# Patient Record
Sex: Male | Born: 1985 | Race: Black or African American | Hispanic: No | Marital: Single | Smoking: Current every day smoker
Health system: Southern US, Community
[De-identification: ages and names within clinical notes are randomized; demographics above are authoritative.]

## PROBLEM LIST (undated history)

## (undated) DIAGNOSIS — I10 Essential (primary) hypertension: Secondary | ICD-10-CM

---

## 2007-10-21 ENCOUNTER — Emergency Department: Payer: Self-pay | Admitting: Emergency Medicine

## 2007-10-22 ENCOUNTER — Emergency Department: Payer: Self-pay | Admitting: Emergency Medicine

## 2007-11-02 ENCOUNTER — Ambulatory Visit: Payer: Self-pay | Admitting: Orthopedic Surgery

## 2007-11-05 ENCOUNTER — Ambulatory Visit: Payer: Self-pay | Admitting: Orthopedic Surgery

## 2007-11-15 ENCOUNTER — Emergency Department: Payer: Self-pay | Admitting: Internal Medicine

## 2007-11-17 ENCOUNTER — Emergency Department: Payer: Self-pay | Admitting: Internal Medicine

## 2012-06-03 ENCOUNTER — Emergency Department: Payer: Self-pay | Admitting: *Deleted

## 2012-11-28 ENCOUNTER — Emergency Department: Payer: Self-pay | Admitting: Emergency Medicine

## 2012-11-28 LAB — CBC
HCT: 44 % (ref 40.0–52.0)
MCHC: 34.6 g/dL (ref 32.0–36.0)
Platelet: 204 10*3/uL (ref 150–440)
WBC: 6.1 10*3/uL (ref 3.8–10.6)

## 2012-11-28 LAB — URINALYSIS, COMPLETE
Bilirubin,UR: NEGATIVE
Ph: 6 (ref 4.5–8.0)
Protein: NEGATIVE
RBC,UR: 3 /HPF (ref 0–5)
WBC UR: <1 /HPF (ref 0–5)

## 2012-11-28 LAB — DRUG SCREEN, URINE
Cocaine Metabolite,Ur ~~LOC~~: NEGATIVE (ref ?–300)
MDMA (Ecstasy)Ur Screen: NEGATIVE (ref ?–500)
Methadone, Ur Screen: NEGATIVE (ref ?–300)
Opiate, Ur Screen: NEGATIVE (ref ?–300)
Phencyclidine (PCP) Ur S: NEGATIVE (ref ?–25)
Tricyclic, Ur Screen: NEGATIVE (ref ?–1000)

## 2012-11-28 LAB — COMPREHENSIVE METABOLIC PANEL
Albumin: 4.3 g/dL (ref 3.4–5.0)
Alkaline Phosphatase: 96 U/L (ref 50–136)
Anion Gap: 8 (ref 7–16)
BUN: 15 mg/dL (ref 7–18)
Bilirubin,Total: 0.2 mg/dL (ref 0.2–1.0)
Calcium, Total: 9.5 mg/dL (ref 8.5–10.1)
Chloride: 109 mmol/L — ABNORMAL HIGH (ref 98–107)
Co2: 26 mmol/L (ref 21–32)
Creatinine: 1.08 mg/dL (ref 0.60–1.30)
EGFR (African American): >60
EGFR (Non-African Amer.): >60
Glucose: 97 mg/dL (ref 65–99)
Osmolality: 286 (ref 275–301)
SGOT(AST): 40 U/L — ABNORMAL HIGH (ref 15–37)
SGPT (ALT): 46 U/L (ref 12–78)
Total Protein: 7.9 g/dL (ref 6.4–8.2)

## 2013-01-11 ENCOUNTER — Emergency Department: Payer: Self-pay | Admitting: Emergency Medicine

## 2014-02-04 ENCOUNTER — Emergency Department: Payer: Self-pay | Admitting: Emergency Medicine

## 2014-03-01 ENCOUNTER — Emergency Department: Payer: Self-pay | Admitting: Emergency Medicine

## 2014-08-18 IMAGING — CR RIGHT FOOT COMPLETE - 3+ VIEW
1 series · 3 of 3 positions shown · non-contrast
Comparison: None.

CLINICAL DATA: Pain post fall

EXAM:
RIGHT FOOT COMPLETE - 3+ VIEW

[Series 1: x foot lat right · 0.14mm/px · 3 of 3 slices shown]
[im 1/3]
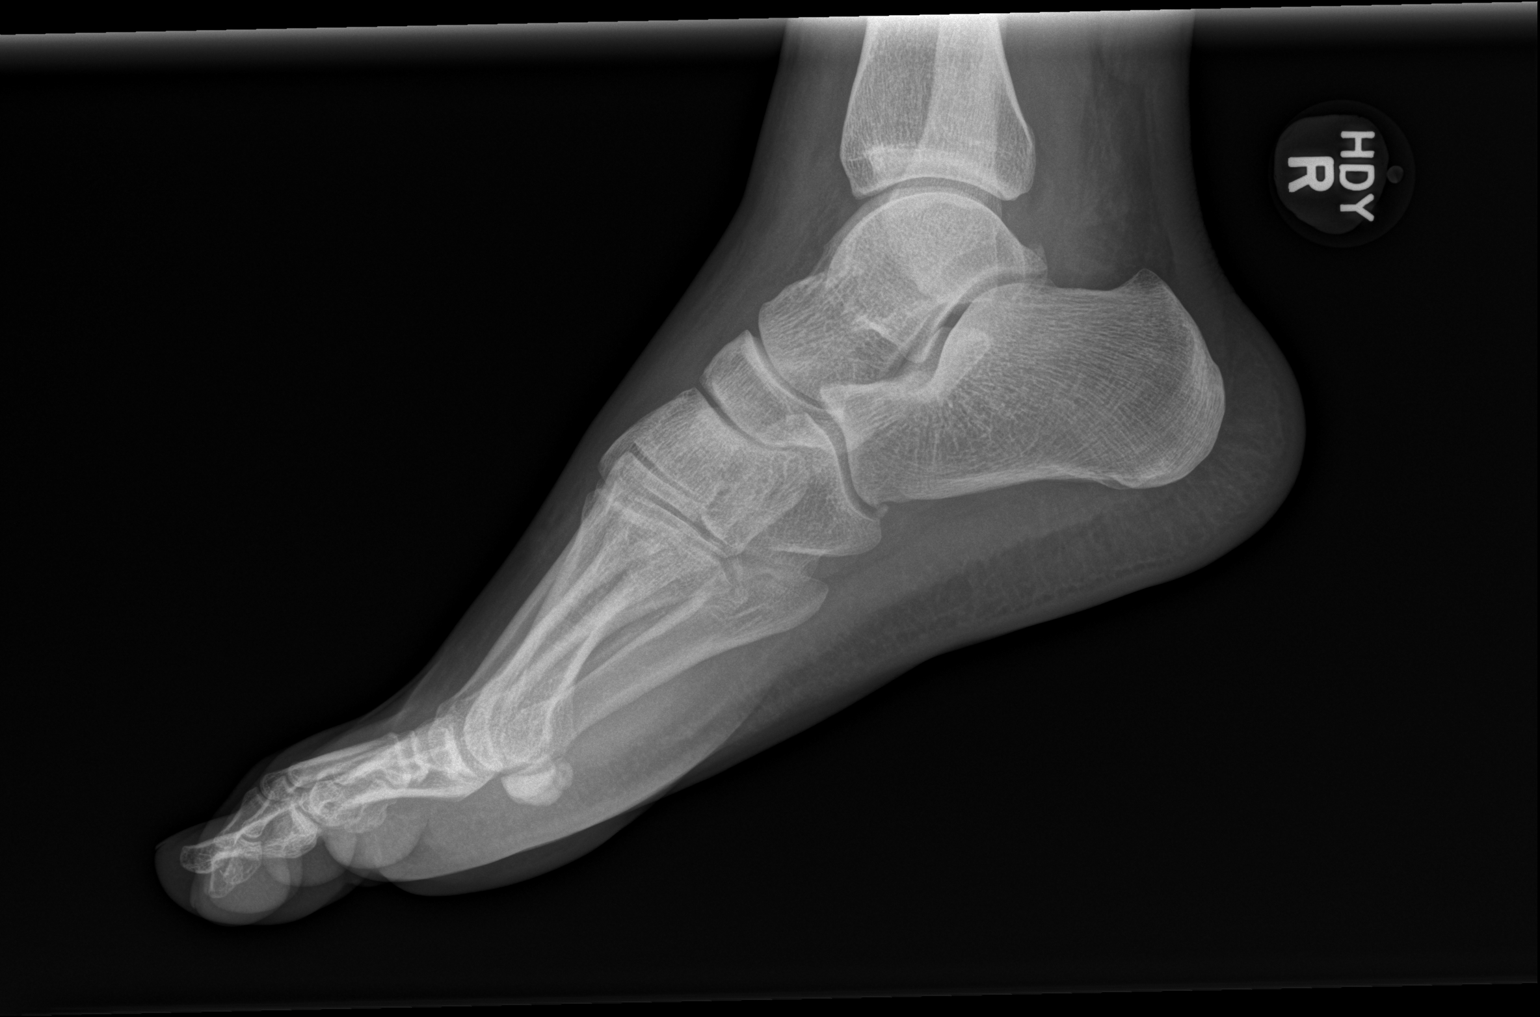
[im 2/3]
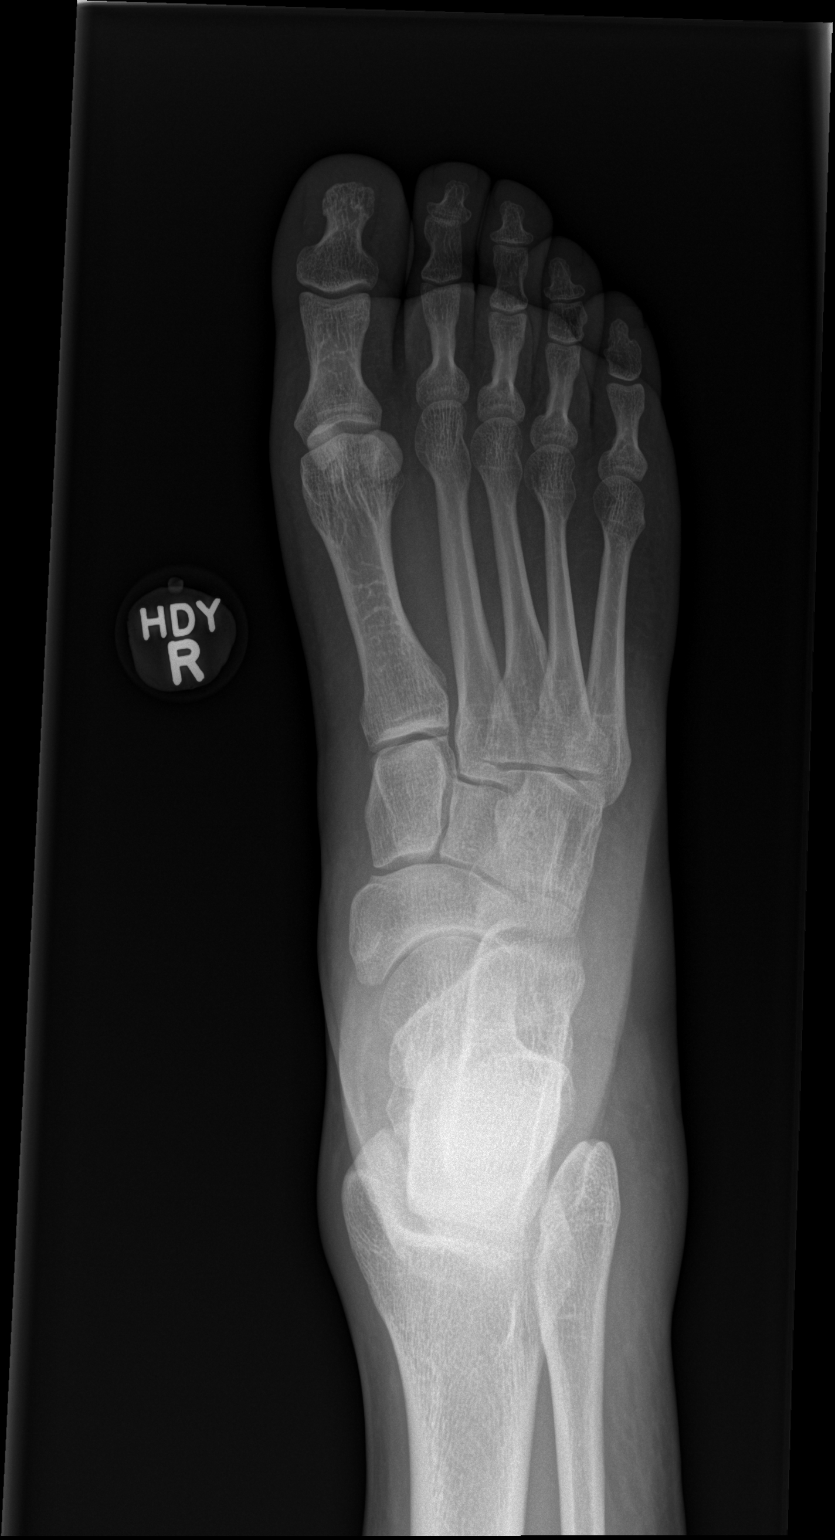
[im 3/3]
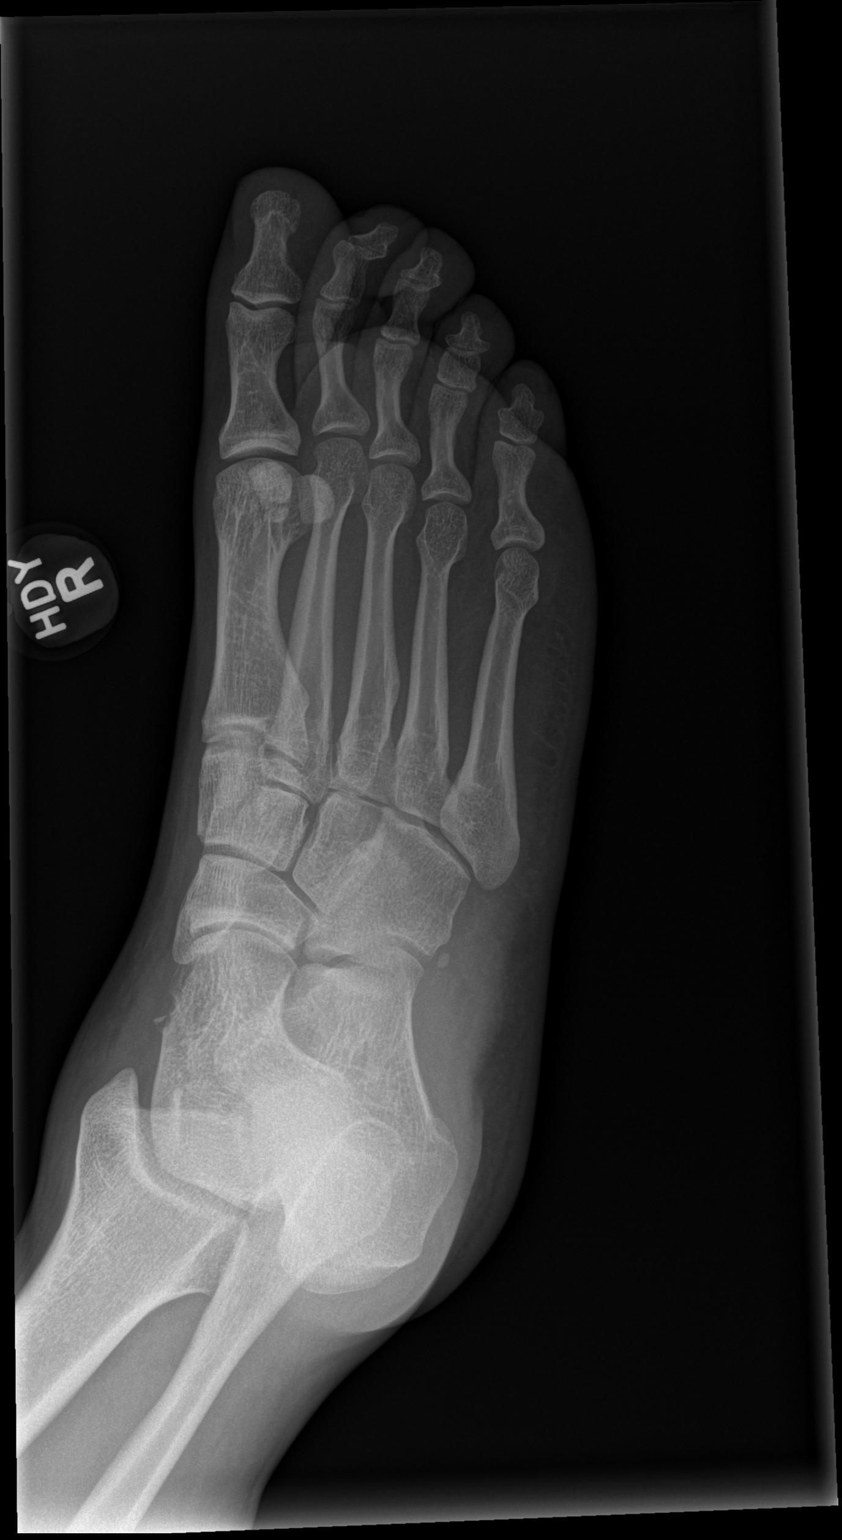

[3 of 3 positions shown; findings below may reference images not displayed]

FINDINGS: Three views of the right foot submitted. No displaced fracture or
subluxation. Tiny bony fragment and cortical irregularity medial
aspect of the talus suspicious for avulsion fracture. Clinical
correlation is necessary.
IMPRESSION: No displaced fracture or subluxation. Tiny bony fragment and
cortical irregularity medial aspect of the talus suspicious for
avulsion fracture. Clinical correlation is necessary.

## 2015-03-20 NOTE — Consult Note (Signed)
Chief Complaint:   Chief Complaint Pt is a 29 yo AAM IVC by his sister.   Presenting Symptoms:   Presenting Symptoms Mood Disturbance  Suicidal Ideation  Substance Use   History of Present Illness:   History of Present Illness Pt was brought by his sister as he posted on the face book that he has SI while being intoxicated and was playing russion roulette with his friends. He has extensive legal history and is currently on probation. He stated that he used Alcohol and only drank couple of glasses on the new year and was eating weed laced brownies with his friends. His BAL - 9 and UDS ids positive for MJ . However, he stated that he is very careful as he gets drug tested often and his probation officer  tested him on Dec 31 and will be tested again on Jan 22. He will use green tea to flush himself off. He stated that he has fair relationship with his sister as she wants him to get help for his drug use. He stated that he was charged with B&E, assault on male and drug possesion in the past but now he is tryng to get help and goes to church every weekend.  He broke with his girlfriend 3 week ago and has no close relationship. He feels depressed and stated that he does not have feel suicidal, homicidal at this time. He denied perceptual disturbances.   Precipitating Event & Recent Stressors:   Precipitating Event & Recent Stressors Legal Problems  Employment Problems  Relationship Problems  Losses  Death of mother.    Precipitating Event & Recent Stressors Relationship issues with girl friend   Target Symptoms:   Depressive Interest Loss  Anhedonia  Depressed Mood    Behavior Impulsivity    Capacity Recognizes the presence of illlness  Understands consequences of treatment refusal  Understands risks, benefits, alternatives  Communicates clear choices  Can care for self independently   Past Psychiatric Treatment: First Treatment: Was in treatment since he was a teenager. He was DX with  Bipolar. No meds.   Previous Hospitalizations: Multiple ER visits. Last seen by Psychiatrist in prison in Oct but no meds prescribed.   History of Suicide Attempts: Denied.   Current Outpatient Treatment: Declined.   Substance Abuse Treatment History: no history.   Current Psychotropic Medications: None.   The patient reports a history of non-compliance with prescribed medication regimen..  Substance Abuse- Alcohol: The patient drinks alcohol socially, not more than 1-2 drinks per week, and denies any binge drinking..  Substance Abuse- Cannabis: The patient currently uses cannabis on a regular basis.Marland Kitchen  PAST MEDICAL & SURGICAL HX:  Significant Events:   bipolar:   Social History: Lives with sister.  Legal History: Extensive legal history. Currently on probation. Just released 2 weeks ago.  Mental Status Exam:   Mental Status Exam Moderately built male who appeared his stated age.    Speech Fluent    Mood Depressed    Affect Depressed    Thought Processes Linear/logical/goal directed    Orientation Self    Attention Alert    Concentration Fair    Memory Intact    Fund of Knowledge Fair    Language Fair    Judgement Fair    Insight Fair    Reliabiity Fair   Suicide Risk Assessment: Suicide Risk Level No risk inidicated.   Pt declined SI or plans. Stated that his siter misunderstood and he contracted for safety.  Review  of Systems:  Review of Systems:   Fever/Chills No    Cough No    Sputum No    Abdominal Pain No    Diarrhea No    Constipation No    Nausea/Vomiting No    SOB/DOE No    Chest Pain No    Telemetry Reviewed Afib    Dysuria No    Tolerating PT Yes    Tolerating Diet Yes    Medications/Allergies Reviewed Medications/Allergies reviewed   Assessment & Diagnosis: Axis I: Bipolar Do NOS.  Alcohol Abuse  Cannabis Dependence.   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   Axis III: see PMH.   Axis IV: Problems with primary  support group  Problems related to social environment  Problems with legal system .   Axis V: moderate.   49.  Treatment Plan: Pt will be released from IVC.  Collateral information from sister and she agreed with the treatment plan.  Pt will be referred to [facility]. He has appoitment on Jan 3 at 3:45 pm. Treatment plan discussed with pt and he demonstrated understanding.  Electronic Signatures: Rhunette CroftFaheem, Clifford Coudriet S (MD)  (Signed 02-Jan-14 09:55)  Authored: Chief Complaint, Presenting Symptoms, History of Present Illness, Precipitating Event & Recent Stressors, Target Symptoms, Past Psychiatric Treatment, Substance Abuse History, PAST MEDICAL & SURGICAL HX, Social History, Legal History, Mental Status Exam, Suicide Risk Assessment, Review of Systems, Assessment & Diagnosis, Treatment Plan   Last Updated: 02-Jan-14 09:55 by Rhunette CroftFaheem, Quenton Recendez S (MD)

## 2019-01-10 ENCOUNTER — Other Ambulatory Visit: Payer: Self-pay

## 2019-01-10 ENCOUNTER — Emergency Department: Payer: Self-pay

## 2019-01-10 ENCOUNTER — Encounter: Payer: Self-pay | Admitting: Emergency Medicine

## 2019-01-10 ENCOUNTER — Emergency Department
Admission: EM | Admit: 2019-01-10 | Discharge: 2019-01-10 | Disposition: A | Discharge status: Home / Self Care | Payer: Self-pay | Attending: Emergency Medicine | Admitting: Emergency Medicine

## 2019-01-10 DIAGNOSIS — N50812 Left testicular pain: Secondary | ICD-10-CM | POA: Insufficient documentation

## 2019-01-10 DIAGNOSIS — F17210 Nicotine dependence, cigarettes, uncomplicated: Secondary | ICD-10-CM | POA: Insufficient documentation

## 2019-01-10 DIAGNOSIS — R102 Pelvic and perineal pain: Secondary | ICD-10-CM | POA: Insufficient documentation

## 2019-01-10 DIAGNOSIS — I10 Essential (primary) hypertension: Secondary | ICD-10-CM | POA: Insufficient documentation

## 2019-01-10 DIAGNOSIS — Z91010 Allergy to peanuts: Secondary | ICD-10-CM | POA: Insufficient documentation

## 2019-01-10 DIAGNOSIS — R52 Pain, unspecified: Secondary | ICD-10-CM

## 2019-01-10 HISTORY — DX: Essential (primary) hypertension: I10

## 2019-01-10 LAB — CHLAMYDIA/NGC RT PCR (ARMC ONLY)
Chlamydia Tr: NOT DETECTED
N GONORRHOEAE: NOT DETECTED

## 2019-01-10 LAB — CBC WITH DIFFERENTIAL/PLATELET
Abs Immature Granulocytes: 0.01 10*3/uL (ref 0.00–0.07)
Basophils Absolute: 0 10*3/uL (ref 0.0–0.1)
Basophils Relative: 1 %
EOS ABS: 0.1 10*3/uL (ref 0.0–0.5)
Eosinophils Relative: 2 %
HCT: 44.4 % (ref 39.0–52.0)
Hemoglobin: 14.9 g/dL (ref 13.0–17.0)
Immature Granulocytes: 0 %
Lymphocytes Relative: 45 %
Lymphs Abs: 2.8 10*3/uL (ref 0.7–4.0)
MCH: 31.6 pg (ref 26.0–34.0)
MCHC: 33.6 g/dL (ref 30.0–36.0)
MCV: 94.3 fL (ref 80.0–100.0)
Monocytes Absolute: 0.5 10*3/uL (ref 0.1–1.0)
Monocytes Relative: 8 %
Neutro Abs: 2.8 10*3/uL (ref 1.7–7.7)
Neutrophils Relative %: 44 %
Platelets: 182 10*3/uL (ref 150–400)
RBC: 4.71 MIL/uL (ref 4.22–5.81)
RDW: 12.6 % (ref 11.5–15.5)
WBC: 6.3 10*3/uL (ref 4.0–10.5)
nRBC: 0 % (ref 0.0–0.2)

## 2019-01-10 LAB — COMPREHENSIVE METABOLIC PANEL
ALT: 28 U/L (ref 0–44)
AST: 30 U/L (ref 15–41)
Albumin: 4.3 g/dL (ref 3.5–5.0)
Alkaline Phosphatase: 53 U/L (ref 38–126)
Anion gap: 9 (ref 5–15)
BUN: 8 mg/dL (ref 6–20)
CO2: 27 mmol/L (ref 22–32)
Calcium: 9.8 mg/dL (ref 8.9–10.3)
Chloride: 104 mmol/L (ref 98–111)
Creatinine, Ser: 1.02 mg/dL (ref 0.61–1.24)
GFR calc Af Amer: >60 mL/min (ref >60)
GFR calc non Af Amer: >60 mL/min (ref >60)
Glucose, Bld: 101 mg/dL — ABNORMAL HIGH (ref 70–99)
Potassium: 3.6 mmol/L (ref 3.5–5.1)
Sodium: 140 mmol/L (ref 135–145)
Total Bilirubin: 0.9 mg/dL (ref 0.3–1.2)
Total Protein: 7.4 g/dL (ref 6.5–8.1)

## 2019-01-10 MED ORDER — ONDANSETRON HCL 4 MG/2ML IJ SOLN
4.0000 mg | Freq: Once | INTRAMUSCULAR | Status: AC
  Administered 2019-01-10: 4 mg via INTRAVENOUS
  Filled 2019-01-10: qty 2

## 2019-01-10 MED ORDER — KETOROLAC TROMETHAMINE 30 MG/ML IJ SOLN
15.0000 mg | Freq: Once | INTRAMUSCULAR | Status: AC
  Administered 2019-01-10: 15 mg via INTRAVENOUS
  Filled 2019-01-10: qty 1

## 2019-01-10 MED ORDER — SULFAMETHOXAZOLE-TRIMETHOPRIM 800-160 MG PO TABS: 1.0000 | ORAL_TABLET | Freq: Two times a day (BID) | ORAL | 0 refills | Status: AC

## 2019-01-10 MED ORDER — MORPHINE SULFATE (PF) 4 MG/ML IV SOLN: 4.0000 mg | Freq: Once | INTRAVENOUS | Status: DC

## 2019-01-10 NOTE — ED Triage Notes (Signed)
Pt here with c/o difficulty urinating this am, tearful in triage, states severe pain in left testicle, has been having a hard time "pushing urine out" since Monday.

## 2019-01-10 NOTE — ED Notes (Signed)
BP in triage 74/59, however pt moving and talking, will recheck in room and chart.

## 2019-01-10 NOTE — ED Provider Notes (Signed)
[facility] Emergency Department Provider Note  ____________________________________________  Time seen: Approximately 7:34 AM  I have reviewed the triage vital signs and the nursing notes.   HISTORY  Chief Complaint Testicle Pain   HPI Norman Sanford is a 33 y.o. male with a history of hypertension presents for evaluation of left testicular pain.  Patient reports the pain started this morning, throbbing, and it became severe very fast.  The pain radiates to the pelvic region.  No penile discharge.  Patient reports feeling the need to push to be able to urinate.  No hematuria or dysuria.  Has had prior STDs.  Is also complaining of swelling of his left testicle.  No trauma.  Past Medical History:  Diagnosis Date  . Hypertension     Allergies Peanut-containing drug products  No family history on file.  Social History Social History   Tobacco Use  . Smoking status: Current Every Day Smoker    Types: Cigarettes  . Smokeless tobacco: Never Used  Substance Use Topics  . Alcohol use: Yes    Comment: Sanford  . Drug use: Not on file    Review of Systems  Constitutional: Negative for fever. Eyes: Negative for visual changes. ENT: Negative for sore throat. Neck: No neck pain  Cardiovascular: Negative for chest pain. Respiratory: Negative for shortness of breath. Gastrointestinal: Negative for abdominal pain, vomiting or diarrhea. Genitourinary: Negative for dysuria. + L testicular pain Musculoskeletal: Negative for back pain. Skin: Negative for rash. Neurological: Negative for headaches, weakness or numbness. Psych: No SI or HI  ____________________________________________   PHYSICAL EXAM:  VITAL SIGNS: ED Triage Vitals  Enc Vitals Group     BP 01/10/19 0722 (!) 153/86     Pulse Rate 01/10/19 0706 (!) 120     Resp 01/10/19 0722 20     Temp 01/10/19 0706 97.8 F (36.6 C)     Temp Source 01/10/19 0706 Oral     SpO2 01/10/19 0706 100 %       Weight 01/10/19 0707 185 lb (83.9 kg)     Height 01/10/19 0707 5\' 6"  (1.676 m)     Head Circumference --      Peak Flow --      Pain Score 01/10/19 0712 10     Pain Loc --      Pain Edu? --      Excl. in GC? --     Constitutional: Alert and oriented, looks uncomfortable.  HEENT:      Head: Normocephalic and atraumatic.         Eyes: Conjunctivae are normal. Sclera is non-icteric.       Mouth/Throat: Mucous membranes are moist.       Neck: Supple with no signs of meningismus. Cardiovascular: Regular rate and rhythm. No murmurs, gallops, or rubs. 2+ symmetrical distal pulses are present in all extremities. No JVD. Respiratory: Normal respiratory effort. Lungs are clear to auscultation bilaterally. No wheezes, crackles, or rhonchi.  Gastrointestinal: Soft, non tender, and non distended with positive bowel sounds. No rebound or guarding. Bilateral testicles are descended, left testicle is very tender to palpation diffusely, R testicle with no tenderness, bilateral positive cremasteric reflexes are present, no swelling or erythema of the scrotum. No evidence of inguinal hernia. Musculoskeletal: Nontender with normal range of motion in all extremities. No edema, cyanosis, or erythema of extremities. Neurologic: Normal speech and language. Face is symmetric. Moving all extremities. No gross focal neurologic deficits are appreciated. Skin: Skin is  warm, dry and intact. No rash noted. Psychiatric: Mood and affect are normal. Speech and behavior are normal.  ____________________________________________   LABS (all labs ordered are listed, but only abnormal results are displayed)  Labs Reviewed  COMPREHENSIVE METABOLIC PANEL - Abnormal; Notable for the following components:      Result Value   Glucose, Bld 101 (*)    All other components within normal limits  CHLAMYDIA/NGC RT PCR ([facility] ONLY)  CBC WITH DIFFERENTIAL/PLATELET    ____________________________________________  EKG  none  ____________________________________________  RADIOLOGY  I have personally reviewed the images performed during this visit and I agree with the Radiologist's read.   Interpretation by Radiologist:  Ct Renal Stone Study  Result Date: 01/10/2019 CLINICAL DATA:  Right scrotal region pain extending into the pelvis. EXAM: CT ABDOMEN AND PELVIS WITHOUT CONTRAST TECHNIQUE: Multidetector CT imaging of the abdomen and pelvis was performed following the standard protocol without oral or IV contrast. COMPARISON:  Scrotal ultrasound January 10, 2019 FINDINGS: Lower chest: Lung bases are clear. Hepatobiliary: No focal liver lesions are evident on this noncontrast enhanced study. Gallbladder wall is not appreciably thickened. There is no biliary duct dilatation. Pancreas: No pancreatic mass or inflammatory focus evident. Spleen: No apparent splenic lesions. Adrenals/Urinary Tract: Adrenals bilaterally appear normal. There is slight nephrocalcinosis bilaterally. There is no well-defined renal calculus on either side. There is no appreciable ureteral calculus on either side. Urinary bladder is midline with wall thickness within normal limits for degree of distention. Stomach/Bowel: There is no appreciable bowel wall or mesenteric thickening. There is no appreciable bowel obstruction. There is no evident free air or portal venous air. Vascular/Lymphatic: No abdominal aortic aneurysm. No vascular lesions are evident on this noncontrast enhanced study. There is no adenopathy appreciable in the abdomen or pelvis. Reproductive: Prostate and seminal vesicles appear normal in size and contour. There is no evident pelvic mass. A small amount of calcification is noted in the anterior angle region in the region of the left spermatic cord. No surrounding fluid or soft tissue stranding. Other: Appendix appears normal. There is no evident abscess or ascites in the  abdomen or pelvis. Musculoskeletal: There are no blastic or lytic bone lesions. No evident fracture or dislocation. No intramuscular lesions or abdominal wall lesions are evident. IMPRESSION: 1. Slight calcification in the region of the left spermatic cord which may represent residua of previous inflammation or trauma. No acute appearing inflammation seen in this area or elsewhere along the spermatic cords. 2. Prostate and seminal vesicles appear normal in size and contour. No evident pelvic mass. 3. Minimal nephrocalcinosis bilaterally. No evident hydronephrosis on either side. No discrete renal calculus. No ureteral calculi. 4. No evident bowel obstruction. No abscess in the abdomen or pelvis. Appendix appears normal. Comment: A cause for patient's symptoms has not been established with this study. Electronically Signed   By: Bretta BangWilliam  Woodruff III M.D.   On: 01/10/2019 09:58   [place] Scrotum W/doppler  Result Date: 01/10/2019 CLINICAL DATA:  RIGHT testicular pain for 1 day EXAM: SCROTAL ULTRASOUND DOPPLER ULTRASOUND OF THE TESTICLES TECHNIQUE: Complete ultrasound examination of the testicles, epididymis, and other scrotal structures was performed. Color and spectral Doppler ultrasound were also utilized to evaluate blood flow to the testicles. COMPARISON:  None FINDINGS: Right testicle Measurements: 3.9 x 2.9 x 2.9 cm. Normal parenchymal echogenicity without mass or calcification. Internal blood flow present on color Doppler imaging. Left testicle Measurements: 4.4 x 2.2 x 3.2 cm. Normal parenchymal echogenicity without mass or calcification.  Internal blood flow present on color Doppler imaging, symmetric with RIGHT. Right epididymis:  Normal appearance Left epididymis:  Normal appearance Hydrocele:  Absent bilaterally Varicocele: BILATERAL varicoceles present, with dilated veins at the spermatic cords bilaterally. Pulsed Doppler interrogation of both testes demonstrates normal low resistance arterial and venous  waveforms bilaterally. IMPRESSION: BILATERAL varicocele. Normal sonographic appearance of the testes and epididymi bilaterally. Electronically Signed   By: Ulyses Southward M.D.   On: 01/10/2019 09:07      ____________________________________________   PROCEDURES  Procedure(s) performed: None Procedures Critical Care performed:  None ____________________________________________   INITIAL IMPRESSION / ASSESSMENT AND PLAN / ED COURSE   33 y.o. male with a history of hypertension presents for evaluation of left testicular pain.  Patient has significant tenderness of the left testicle with no obvious evidence of torsion, no swelling or erythema.  Differential diagnoses including epididymitis versus orchitis versus torsion.  Patient refused narcotics for pain control.  Will give Toradol and Zofran and send patient for Doppler ultrasound of his testicle.  Labs are pending.  GC and chlamydia pending.  Clinical Course as of Jan 10 1243  Thu Jan 10, 2019  1241 Imaging studies with no acute findings.  STD testing is negative.  Patient reports that his pain has resolved at this time.  I do believe his presentation is either mild orchitis/epididymitis not showing yet on ultrasound since symptoms just started a few hours ago versus testicular torsion which resolved prior to arrival.  Therefore will put patient on Bactrim and recommended return to the emergency room if the pain recurs.  Otherwise he will follow-up with his primary care doctor   [CV]    Clinical Course User Index [CV] Don Perking [place], MD     As part of my medical decision making, I reviewed the following data within the electronic MEDICAL RECORD NUMBER Nursing notes reviewed and incorporated, Labs reviewed , Old chart reviewed, Radiograph reviewed , Notes from prior ED visits and [place] Controlled Substance Database    Pertinent labs & imaging results that were available during my care of the patient were reviewed by me and considered in  my medical decision making (see chart for details).    ____________________________________________   FINAL CLINICAL IMPRESSION(S) / ED DIAGNOSES  Final diagnoses:  Left testicular pain      NEW MEDICATIONS STARTED DURING THIS VISIT:  ED Discharge Orders         Ordered    sulfamethoxazole-trimethoprim (BACTRIM DS,SEPTRA DS) 800-160 MG tablet  2 times Sanford     01/10/19 1242           Note:  This document was prepared using Dragon voice recognition software and may include unintentional dictation errors.    Don Perking, [place], MD 01/10/19 [phone]

## 2019-01-10 NOTE — Discharge Instructions (Addendum)
Please take the antibiotics as prescribed, twice a day for 7 days.  Take Tylenol or Motrin for pain.  Return to the emergency room if you have recurrence of severe testicular pain as this may be sign of testicular torsion which is a surgical emergency and if untreated within a short period of time may cause her to lose her testicle.

## 2019-06-29 IMAGING — CT CT RENAL STONE PROTOCOL
3 of 4 series · 7 of 46 positions shown, 13 images · non-contrast
Comparison: Scrotal ultrasound January 10, 2019

CLINICAL DATA: Right scrotal region pain extending into the pelvis.

EXAM:
CT ABDOMEN AND PELVIS WITHOUT CONTRAST
TECHNIQUE: Multidetector CT imaging of the abdomen and pelvis was performed
following the standard protocol without oral or IV contrast.

[Series 4: lung bases · axial · 0.70mm/px · z∈[-851,-806]mm · 3 of 19 slices shown, 7 images]
[im 5/19  soft-tissue]
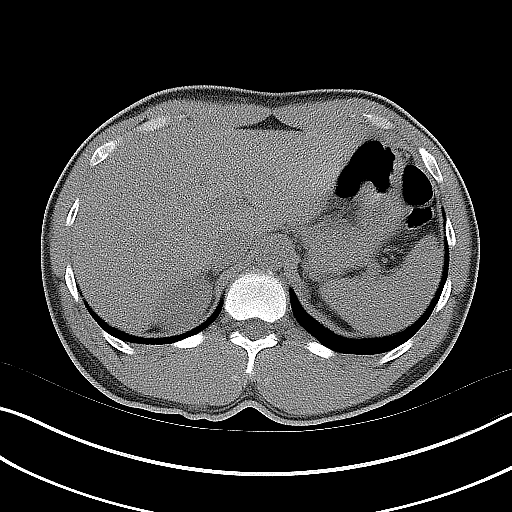
[im 5/19  lung]
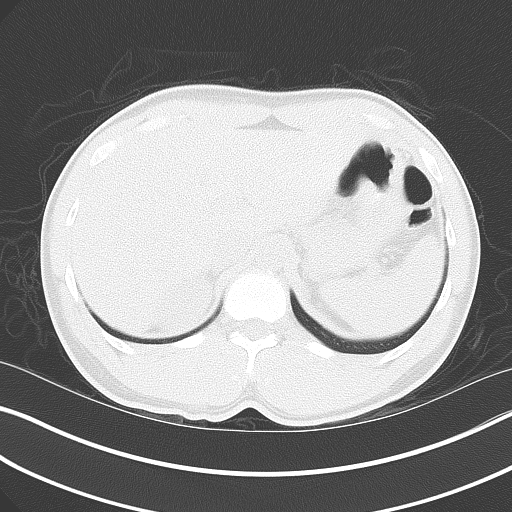
[im 5/19  bone]
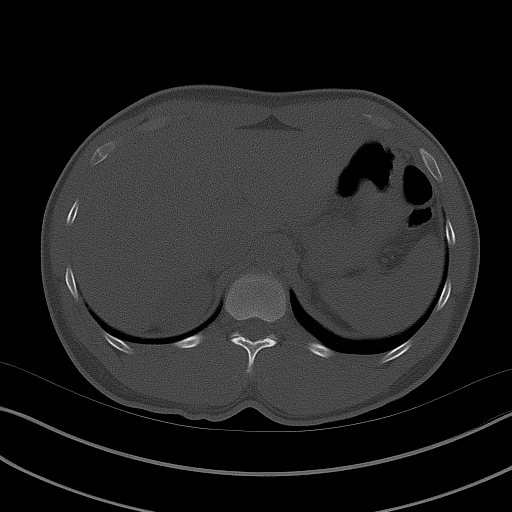
[im 10/19  soft-tissue]
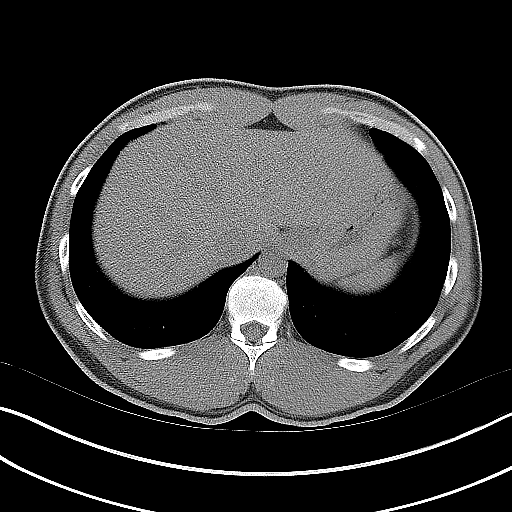
[im 10/19  lung]
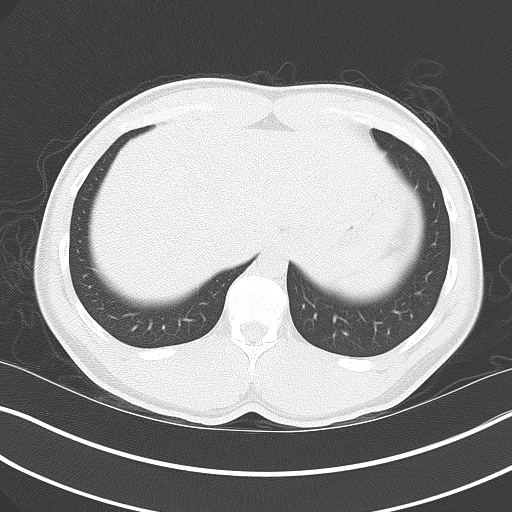
[im 14/19  soft-tissue]
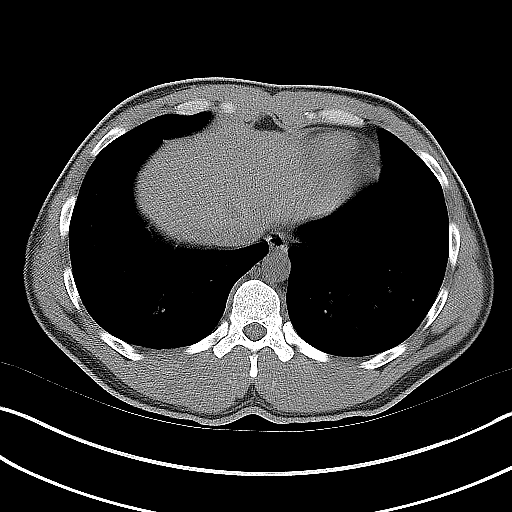
[im 14/19  lung]
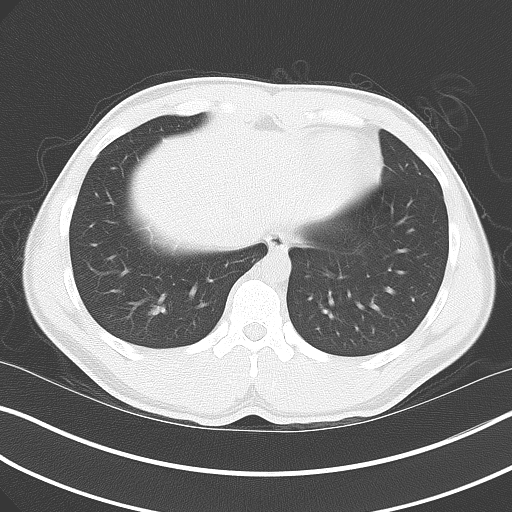

[Series 5: coronal · coronal · 0.71mm/px · 3 of 122 slices shown, 4 images]
[im 41/122  soft-tissue]
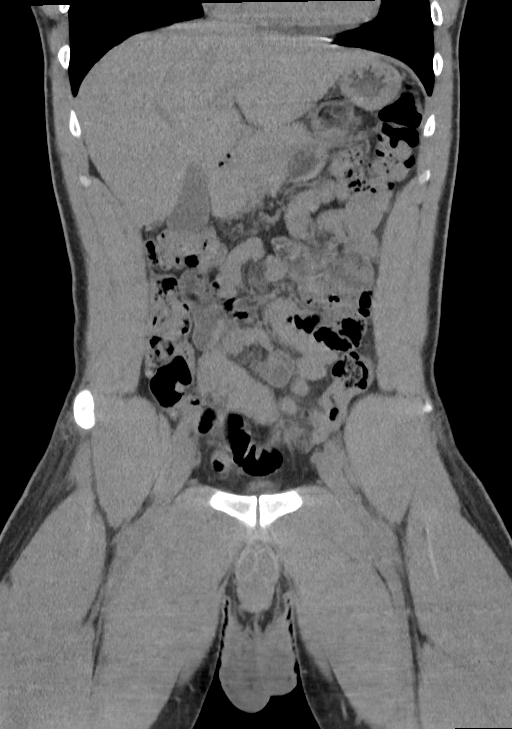
[im 54/122  soft-tissue]
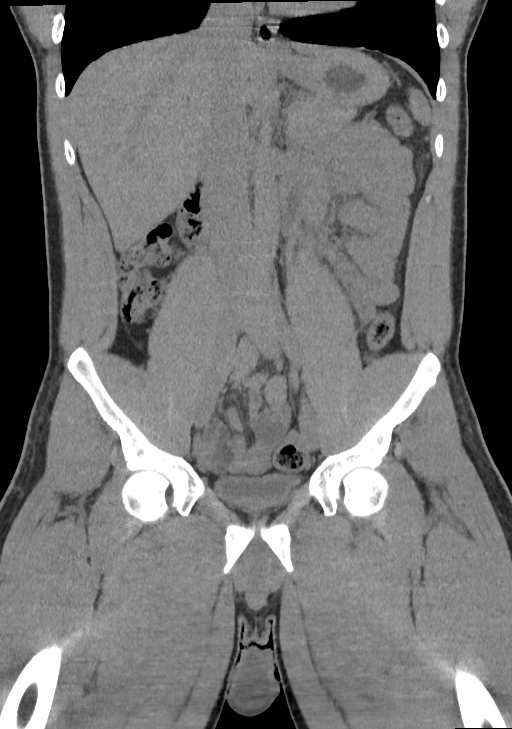
[im 54/122  bone]
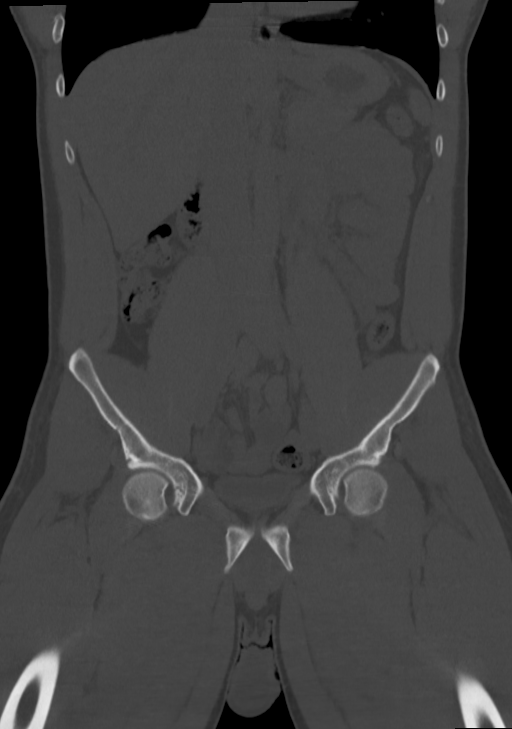
[im 68/122  soft-tissue]
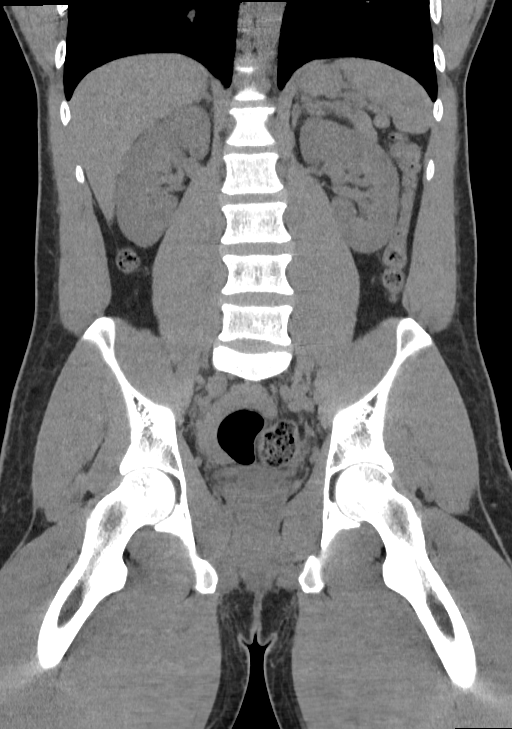

[Series 6: sagittal · sagittal · 0.48mm/px · 1 of 183 slices shown, 2 images]
[im 61/183  soft-tissue]
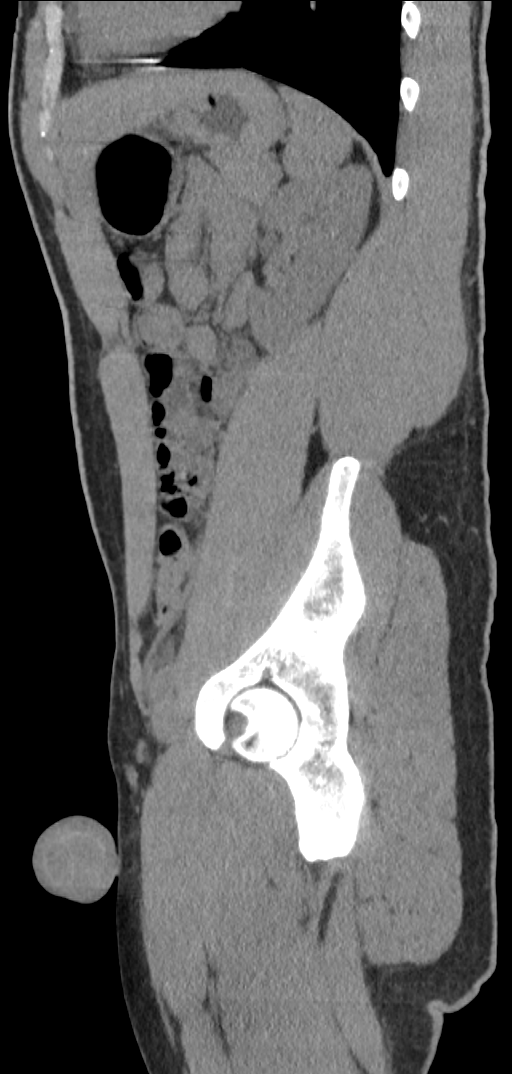
[im 61/183  bone]
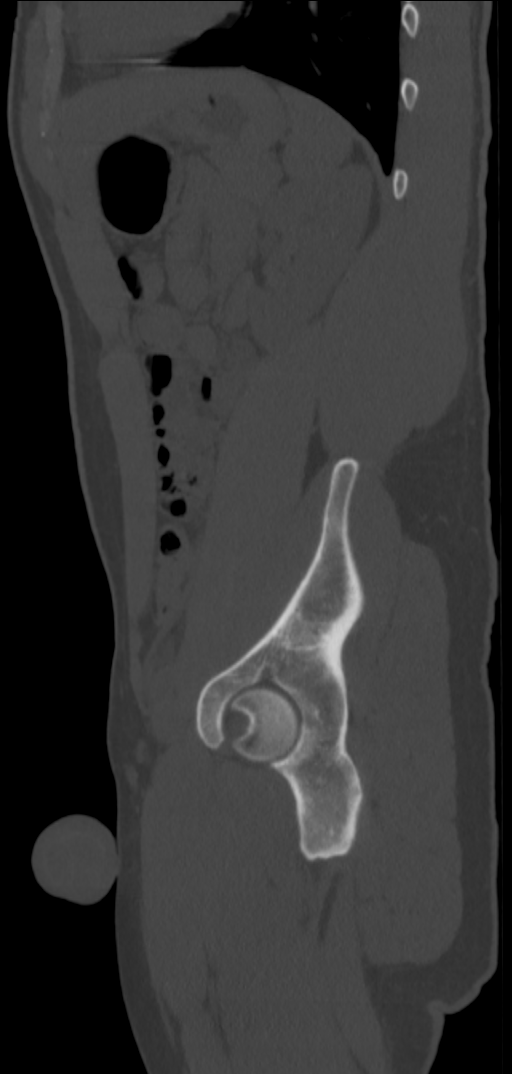

[7 of 46 positions shown; findings below may reference images not displayed]

FINDINGS: Lower chest: Lung bases are clear.

Hepatobiliary: No focal liver lesions are evident on this
noncontrast enhanced study. Gallbladder wall is not appreciably
thickened. There is no biliary duct dilatation.

Pancreas: No pancreatic mass or inflammatory focus evident.

Spleen: No apparent splenic lesions.

Adrenals/Urinary Tract: Adrenals bilaterally appear normal. There is
slight nephrocalcinosis bilaterally. There is no well-defined renal
calculus on either side. There is no appreciable ureteral calculus
on either side. Urinary bladder is midline with wall thickness
within normal limits for degree of distention.

Stomach/Bowel: There is no appreciable bowel wall or mesenteric
thickening. There is no appreciable bowel obstruction. There is no
evident free air or portal venous air.

Vascular/Lymphatic: No abdominal aortic aneurysm. No vascular
lesions are evident on this noncontrast enhanced study. There is no
adenopathy appreciable in the abdomen or pelvis.

Reproductive: Prostate and seminal vesicles appear normal in size
and contour. There is no evident pelvic mass. A small amount of
calcification is noted in the anterior angle region in the region of
the left spermatic cord. No surrounding fluid or soft tissue
stranding.

Other: Appendix appears normal. There is no evident abscess or
ascites in the abdomen or pelvis.

Musculoskeletal: There are no blastic or lytic bone lesions. No
evident fracture or dislocation. No intramuscular lesions or
abdominal wall lesions are evident.
IMPRESSION: 1. Slight calcification in the region of the left spermatic cord
which may represent residua of previous inflammation or trauma. No
acute appearing inflammation seen in this area or elsewhere along
the spermatic cords.

2. Prostate and seminal vesicles appear normal in size and contour.
No evident pelvic mass.

3. Minimal nephrocalcinosis bilaterally. No evident hydronephrosis
on either side. No discrete renal calculus. No ureteral calculi.

4. No evident bowel obstruction. No abscess in the abdomen or
pelvis. Appendix appears normal.

Comment: A cause for patient's symptoms has not been established
with this study.

## 2019-12-04 ENCOUNTER — Emergency Department
Admission: EM | Admit: 2019-12-04 | Discharge: 2019-12-04 | Disposition: A | Discharge status: Home / Self Care | Payer: Self-pay | Attending: Emergency Medicine | Admitting: Emergency Medicine

## 2019-12-04 ENCOUNTER — Other Ambulatory Visit: Payer: Self-pay

## 2019-12-04 ENCOUNTER — Encounter: Payer: Self-pay | Admitting: Medical Oncology

## 2019-12-04 DIAGNOSIS — S8012XA Contusion of left lower leg, initial encounter: Secondary | ICD-10-CM

## 2019-12-04 DIAGNOSIS — S61211A Laceration without foreign body of left index finger without damage to nail, initial encounter: Secondary | ICD-10-CM | POA: Insufficient documentation

## 2019-12-04 DIAGNOSIS — Y939 Activity, unspecified: Secondary | ICD-10-CM | POA: Insufficient documentation

## 2019-12-04 DIAGNOSIS — Y998 Other external cause status: Secondary | ICD-10-CM | POA: Insufficient documentation

## 2019-12-04 DIAGNOSIS — Z23 Encounter for immunization: Secondary | ICD-10-CM | POA: Insufficient documentation

## 2019-12-04 DIAGNOSIS — I10 Essential (primary) hypertension: Secondary | ICD-10-CM | POA: Insufficient documentation

## 2019-12-04 DIAGNOSIS — Y929 Unspecified place or not applicable: Secondary | ICD-10-CM | POA: Insufficient documentation

## 2019-12-04 DIAGNOSIS — F17210 Nicotine dependence, cigarettes, uncomplicated: Secondary | ICD-10-CM | POA: Insufficient documentation

## 2019-12-04 DIAGNOSIS — Z91010 Allergy to peanuts: Secondary | ICD-10-CM | POA: Insufficient documentation

## 2019-12-04 DIAGNOSIS — S8011XA Contusion of right lower leg, initial encounter: Secondary | ICD-10-CM | POA: Insufficient documentation

## 2019-12-04 MED ORDER — TETANUS-DIPHTH-ACELL PERTUSSIS 5-2.5-18.5 LF-MCG/0.5 IM SUSP
0.5000 mL | Freq: Once | INTRAMUSCULAR | Status: AC
  Administered 2019-12-04: 06:00:00 0.5 mL via INTRAMUSCULAR
  Filled 2019-12-04: qty 0.5

## 2019-12-04 MED ORDER — ACETAMINOPHEN 325 MG PO TABS
650.0000 mg | ORAL_TABLET | Freq: Once | ORAL | Status: AC
  Administered 2019-12-04: 650 mg via ORAL
  Filled 2019-12-04: qty 2

## 2019-12-04 NOTE — Discharge Instructions (Addendum)
Keep wound clean and dry.  Return to the ER for worsening symptoms, increased redness/swelling, purulent discharge or other concerns. 
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
## 2019-12-04 NOTE — ED Notes (Signed)
Pt presents with pain in the right lower calf with swelling to that area.  Pt also has laceration of left index finger.  Pt is accompanied by [employer] and is in police custody.
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
## 2020-03-28 DEATH — deceased
